# Patient Record
Sex: Female | Born: 1970 | Race: White | Hispanic: No | Marital: Married | Smoking: Never smoker
Health system: Southern US, Community
[De-identification: ages and names within clinical notes are randomized; demographics above are authoritative.]

## PROBLEM LIST (undated history)

## (undated) DIAGNOSIS — N200 Calculus of kidney: Secondary | ICD-10-CM

---

## 1997-12-05 ENCOUNTER — Other Ambulatory Visit: Admission: RE | Admit: 1997-12-05 | Discharge: 1997-12-05 | Payer: Self-pay | Admitting: *Deleted

## 1997-12-30 ENCOUNTER — Ambulatory Visit (HOSPITAL_COMMUNITY): Admission: RE | Admit: 1997-12-30 | Discharge: 1997-12-30 | Payer: Self-pay | Admitting: Obstetrics

## 1998-01-07 ENCOUNTER — Encounter: Admission: RE | Admit: 1998-01-07 | Discharge: 1998-04-07 | Payer: Self-pay | Admitting: Obstetrics & Gynecology

## 1998-02-06 ENCOUNTER — Ambulatory Visit (HOSPITAL_COMMUNITY): Admission: RE | Admit: 1998-02-06 | Discharge: 1998-02-06 | Payer: Self-pay

## 1998-03-06 ENCOUNTER — Ambulatory Visit (HOSPITAL_COMMUNITY): Admission: RE | Admit: 1998-03-06 | Discharge: 1998-03-06 | Payer: Self-pay | Admitting: Obstetrics & Gynecology

## 1998-03-26 ENCOUNTER — Inpatient Hospital Stay (HOSPITAL_COMMUNITY): Admission: RE | Admit: 1998-03-26 | Discharge: 1998-03-28 | Payer: Self-pay | Admitting: Obstetrics & Gynecology

## 1998-04-02 ENCOUNTER — Ambulatory Visit (HOSPITAL_COMMUNITY): Admission: RE | Admit: 1998-04-02 | Discharge: 1998-04-02 | Payer: Self-pay | Admitting: Obstetrics & Gynecology

## 1998-04-09 ENCOUNTER — Encounter: Admission: RE | Admit: 1998-04-09 | Discharge: 1998-07-08 | Payer: Self-pay | Admitting: Obstetrics & Gynecology

## 1998-04-09 ENCOUNTER — Ambulatory Visit (HOSPITAL_COMMUNITY): Admission: RE | Admit: 1998-04-09 | Discharge: 1998-04-09 | Payer: Self-pay | Admitting: Obstetrics & Gynecology

## 1998-04-15 ENCOUNTER — Inpatient Hospital Stay (HOSPITAL_COMMUNITY): Admission: AD | Admit: 1998-04-15 | Discharge: 1998-04-18 | Payer: Self-pay | Admitting: *Deleted

## 1998-04-15 ENCOUNTER — Encounter (HOSPITAL_COMMUNITY): Admission: RE | Admit: 1998-04-15 | Discharge: 1998-06-02 | Payer: Self-pay | Admitting: Obstetrics

## 1998-04-24 ENCOUNTER — Ambulatory Visit (HOSPITAL_COMMUNITY): Admission: RE | Admit: 1998-04-24 | Discharge: 1998-04-24 | Payer: Self-pay | Admitting: Obstetrics & Gynecology

## 1998-05-31 ENCOUNTER — Inpatient Hospital Stay (HOSPITAL_COMMUNITY): Admission: AD | Admit: 1998-05-31 | Discharge: 1998-06-02 | Payer: Self-pay | Admitting: *Deleted

## 2006-01-05 ENCOUNTER — Encounter: Payer: Self-pay | Admitting: Obstetrics & Gynecology

## 2006-08-29 ENCOUNTER — Inpatient Hospital Stay (HOSPITAL_COMMUNITY): Admission: AD | Admit: 2006-08-29 | Discharge: 2006-08-29 | Payer: Self-pay | Admitting: Obstetrics and Gynecology

## 2007-12-19 ENCOUNTER — Emergency Department (HOSPITAL_COMMUNITY): Admission: EM | Admit: 2007-12-19 | Discharge: 2007-12-19 | Payer: Self-pay | Admitting: Emergency Medicine

## 2008-07-22 ENCOUNTER — Inpatient Hospital Stay (HOSPITAL_COMMUNITY): Admission: AD | Admit: 2008-07-22 | Discharge: 2008-07-22 | Payer: Self-pay | Admitting: Obstetrics & Gynecology

## 2008-08-20 ENCOUNTER — Ambulatory Visit (HOSPITAL_COMMUNITY): Admission: RE | Admit: 2008-08-20 | Discharge: 2008-08-20 | Payer: Self-pay | Admitting: Family Medicine

## 2008-09-10 ENCOUNTER — Ambulatory Visit (HOSPITAL_COMMUNITY): Admission: RE | Admit: 2008-09-10 | Discharge: 2008-09-10 | Payer: Self-pay | Admitting: Family Medicine

## 2008-09-26 ENCOUNTER — Ambulatory Visit (HOSPITAL_COMMUNITY): Admission: RE | Admit: 2008-09-26 | Discharge: 2008-09-26 | Payer: Self-pay | Admitting: Family Medicine

## 2009-02-21 ENCOUNTER — Inpatient Hospital Stay (HOSPITAL_COMMUNITY): Admission: AD | Admit: 2009-02-21 | Discharge: 2009-02-23 | Payer: Self-pay | Admitting: Family Medicine

## 2009-08-22 ENCOUNTER — Ambulatory Visit (HOSPITAL_COMMUNITY): Admission: RE | Admit: 2009-08-22 | Discharge: 2009-08-22 | Payer: Self-pay | Admitting: Obstetrics and Gynecology

## 2010-02-13 IMAGING — RF DG HYSTEROGRAM (HSD)
5 series · 5 of 5 positions shown · non-contrast
Comparison: none

CLINICAL DATA: Post Essure evaluation

HYSTEROSALPINGOGRAM
TECHNIQUE: Hysterosalpingogram was performed by the ordering
physician under fluoroscopy.  Fluoroscopic images are submitted for
interpretation following the procedure.
Fluoroscopy Time:  0.9 minutes.

[Series 1: run · 1 of 1 slices shown (1 of 5)]
[im 1/1]
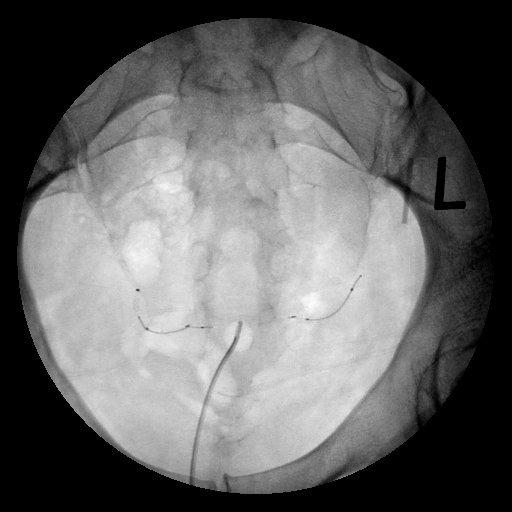

[Series 2: run · 1 of 1 slices shown (2 of 5)]
[im 1/1]
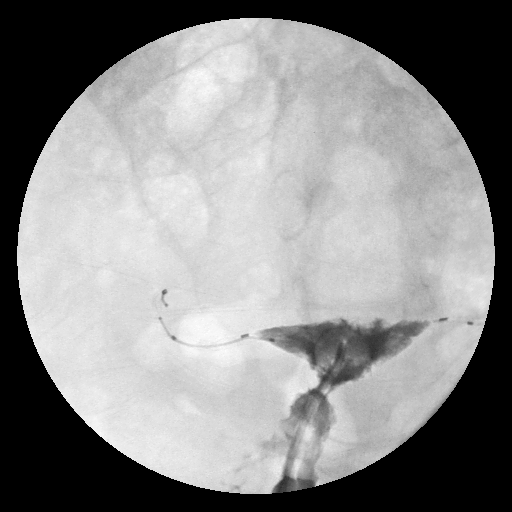

[Series 3: run · 1 of 1 slices shown (3 of 5)]
[im 1/1]
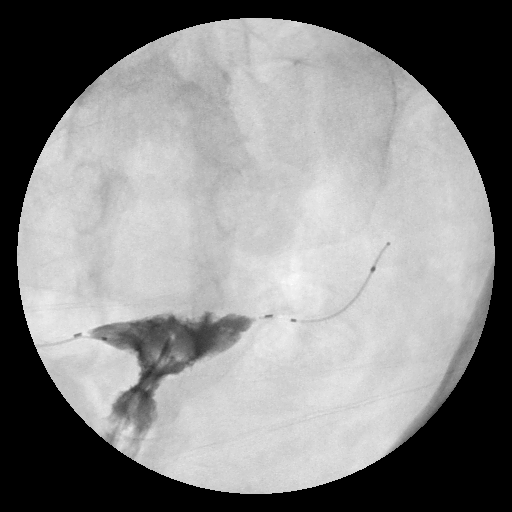

[Series 4: run · 1 of 1 slices shown (4 of 5)]
[im 1/1]
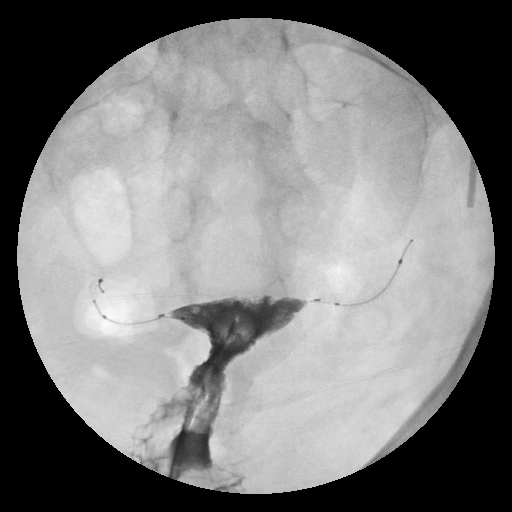

[Series 5: run · 1 of 1 slices shown (5 of 5)]
[im 1/1]
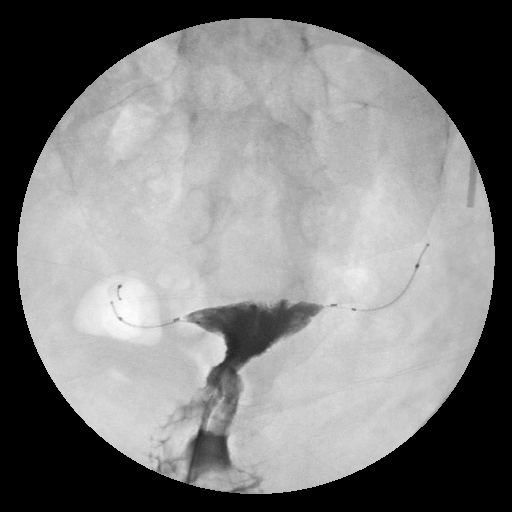

[5 of 5 positions shown; findings below may reference images not displayed]

FINDINGS: Bilateral Essure implants are identified with the
proximal coil located at the tubal-cornual junction on the left and
crossing the tubal-cornual junction on the right.  Both implants
are in appropriate location.  No evidence for contrast is seen
beyond the level of the cornua bilaterally and suggests functional
occlusion bilaterally.

The visualized portion of the endometrium is unremarkable
IMPRESSION: Findings compatible with appropriate Essure implant placement and
correlating with functional occlusion.

## 2010-12-14 ENCOUNTER — Emergency Department: Payer: Self-pay | Admitting: Emergency Medicine

## 2010-12-14 LAB — CBC
HCT: 34 % — ABNORMAL LOW (ref 36.0–46.0)
Hemoglobin: 11.9 g/dL — ABNORMAL LOW (ref 12.0–15.0)
Hemoglobin: 12.5 g/dL (ref 12.0–15.0)
MCHC: 34.4 g/dL (ref 30.0–36.0)
MCV: 91.7 fL (ref 78.0–100.0)
RBC: 3.71 MIL/uL — ABNORMAL LOW (ref 3.87–5.11)
RBC: 3.98 MIL/uL (ref 3.87–5.11)
WBC: 15.7 10*3/uL — ABNORMAL HIGH (ref 4.0–10.5)

## 2011-06-01 LAB — URINALYSIS, ROUTINE W REFLEX MICROSCOPIC
Glucose, UA: NEGATIVE
Specific Gravity, Urine: 1.003 — ABNORMAL LOW
pH: 6.5

## 2011-06-01 LAB — WET PREP, GENITAL: Trich, Wet Prep: NONE SEEN

## 2011-06-01 LAB — CBC
HCT: 44.9
MCV: 91.1
RBC: 4.92
WBC: 10.8 — ABNORMAL HIGH

## 2011-06-01 LAB — DIFFERENTIAL
Eosinophils Absolute: 0.2
Eosinophils Relative: 2
Lymphocytes Relative: 24
Lymphs Abs: 2.6
Monocytes Relative: 6
Neutrophils Relative %: 67

## 2011-06-01 LAB — BASIC METABOLIC PANEL
Chloride: 103
GFR calc Af Amer: >60
Potassium: 3.9

## 2011-06-01 LAB — URINE MICROSCOPIC-ADD ON

## 2011-06-01 LAB — GC/CHLAMYDIA PROBE AMP, GENITAL: Chlamydia, DNA Probe: NEGATIVE

## 2011-06-08 LAB — WET PREP, GENITAL: Trich, Wet Prep: NONE SEEN

## 2011-06-08 LAB — URINALYSIS, ROUTINE W REFLEX MICROSCOPIC
Bilirubin Urine: NEGATIVE
Nitrite: NEGATIVE
Protein, ur: NEGATIVE
Specific Gravity, Urine: >1.03 — ABNORMAL HIGH
Urobilinogen, UA: 0.2

## 2011-06-08 LAB — GC/CHLAMYDIA PROBE AMP, GENITAL
Chlamydia, DNA Probe: NEGATIVE
GC Probe Amp, Genital: NEGATIVE

## 2011-06-08 LAB — URINE MICROSCOPIC-ADD ON

## 2012-11-17 ENCOUNTER — Emergency Department: Payer: Self-pay | Admitting: Emergency Medicine

## 2012-11-17 LAB — URINALYSIS, COMPLETE
Ketone: NEGATIVE
Ph: 7 (ref 4.5–8.0)
Protein: NEGATIVE
RBC,UR: 53 /HPF (ref 0–5)
Squamous Epithelial: 2
WBC UR: 2 /HPF (ref 0–5)

## 2012-11-17 LAB — PREGNANCY, URINE: Pregnancy Test, Urine: NEGATIVE m[IU]/mL

## 2012-11-17 LAB — WET PREP, GENITAL

## 2014-06-10 ENCOUNTER — Emergency Department: Payer: Self-pay | Admitting: Emergency Medicine

## 2015-04-08 ENCOUNTER — Encounter: Payer: Self-pay | Admitting: Podiatry

## 2015-04-08 ENCOUNTER — Ambulatory Visit (INDEPENDENT_AMBULATORY_CARE_PROVIDER_SITE_OTHER): Payer: 59

## 2015-04-08 ENCOUNTER — Ambulatory Visit (INDEPENDENT_AMBULATORY_CARE_PROVIDER_SITE_OTHER): Payer: 59 | Admitting: Podiatry

## 2015-04-08 VITALS — BP 133/88 | HR 93 | Resp 18 | Ht 66.0 in | Wt 195.0 lb

## 2015-04-08 DIAGNOSIS — M722 Plantar fascial fibromatosis: Secondary | ICD-10-CM

## 2015-04-08 DIAGNOSIS — M79673 Pain in unspecified foot: Secondary | ICD-10-CM | POA: Diagnosis not present

## 2015-04-08 MED ORDER — MELOXICAM 7.5 MG PO TABS: 7.5000 mg | ORAL_TABLET | Freq: Every day | ORAL | Status: DC

## 2015-04-08 NOTE — Patient Instructions (Signed)
Plantar Fasciitis (Heel Spur Syndrome) with Rehab The plantar fascia is a fibrous, ligament-like, soft-tissue structure that spans the bottom of the foot. Plantar fasciitis is a condition that causes pain in the foot due to inflammation of the tissue. SYMPTOMS   Pain and tenderness on the underneath side of the foot.  Pain that worsens with standing or walking. CAUSES  Plantar fasciitis is caused by irritation and injury to the plantar fascia on the underneath side of the foot. Common mechanisms of injury include:  Direct trauma to bottom of the foot.  Damage to a small nerve that runs under the foot where the main fascia attaches to the heel bone.  Stress placed on the plantar fascia due to bone spurs. RISK INCREASES WITH:   Activities that place stress on the plantar fascia (running, jumping, pivoting, or cutting).  Poor strength and flexibility.  Improperly fitted shoes.  Tight calf muscles.  Flat feet.  Failure to warm-up properly before activity.  Obesity. PREVENTION  Warm up and stretch properly before activity.  Allow for adequate recovery between workouts.  Maintain physical fitness:  Strength, flexibility, and endurance.  Cardiovascular fitness.  Maintain a health body weight.  Avoid stress on the plantar fascia.  Wear properly fitted shoes, including arch supports for individuals who have flat feet. PROGNOSIS  If treated properly, then the symptoms of plantar fasciitis usually resolve without surgery. However, occasionally surgery is necessary. RELATED COMPLICATIONS   Recurrent symptoms that may result in a chronic condition.  Problems of the lower back that are caused by compensating for the injury, such as limping.  Pain or weakness of the foot during push-off following surgery.  Chronic inflammation, scarring, and partial or complete fascia tear, occurring more often from repeated injections. TREATMENT  Treatment initially involves the use of  ice and medication to help reduce pain and inflammation. The use of strengthening and stretching exercises may help reduce pain with activity, especially stretches of the Achilles tendon. These exercises may be performed at home or with a therapist. Your caregiver may recommend that you use heel cups of arch supports to help reduce stress on the plantar fascia. Occasionally, corticosteroid injections are given to reduce inflammation. If symptoms persist for greater than 6 months despite non-surgical (conservative), then surgery may be recommended.  MEDICATION   If pain medication is necessary, then nonsteroidal anti-inflammatory medications, such as aspirin and ibuprofen, or other minor pain relievers, such as acetaminophen, are often recommended.  Do not take pain medication within 7 days before surgery.  Prescription pain relievers may be given if deemed necessary by your caregiver. Use only as directed and only as much as you need.  Corticosteroid injections may be given by your caregiver. These injections should be reserved for the most serious cases, because they may only be given a certain number of times. HEAT AND COLD  Cold treatment (icing) relieves pain and reduces inflammation. Cold treatment should be applied for 10 to 15 minutes every 2 to 3 hours for inflammation and pain and immediately after any activity that aggravates your symptoms. Use ice packs or massage the area with a piece of ice (ice massage).  Heat treatment may be used prior to performing the stretching and strengthening activities prescribed by your caregiver, physical therapist, or athletic trainer. Use a heat pack or soak the injury in warm water. SEEK IMMEDIATE MEDICAL CARE IF:  Treatment seems to offer no benefit, or the condition worsens.  Any medications produce adverse side effects. EXERCISES RANGE   OF MOTION (ROM) AND STRETCHING EXERCISES - Plantar Fasciitis (Heel Spur Syndrome) These exercises may help you  when beginning to rehabilitate your injury. Your symptoms may resolve with or without further involvement from your physician, physical therapist or athletic trainer. While completing these exercises, remember:   Restoring tissue flexibility helps normal motion to return to the joints. This allows healthier, less painful movement and activity.  An effective stretch should be held for at least 30 seconds.  A stretch should never be painful. You should only feel a gentle lengthening or release in the stretched tissue. RANGE OF MOTION - Toe Extension, Flexion  Sit with your right / left leg crossed over your opposite knee.  Grasp your toes and gently pull them back toward the top of your foot. You should feel a stretch on the bottom of your toes and/or foot.  Hold this stretch for __________ seconds.  Now, gently pull your toes toward the bottom of your foot. You should feel a stretch on the top of your toes and or foot.  Hold this stretch for __________ seconds. Repeat __________ times. Complete this stretch __________ times per day.  RANGE OF MOTION - Ankle Dorsiflexion, Active Assisted  Remove shoes and sit on a chair that is preferably not on a carpeted surface.  Place right / left foot under knee. Extend your opposite leg for support.  Keeping your heel down, slide your right / left foot back toward the chair until you feel a stretch at your ankle or calf. If you do not feel a stretch, slide your bottom forward to the edge of the chair, while still keeping your heel down.  Hold this stretch for __________ seconds. Repeat __________ times. Complete this stretch __________ times per day.  STRETCH - Gastroc, Standing  Place hands on wall.  Extend right / left leg, keeping the front knee somewhat bent.  Slightly point your toes inward on your back foot.  Keeping your right / left heel on the floor and your knee straight, shift your weight toward the wall, not allowing your back to  arch.  You should feel a gentle stretch in the right / left calf. Hold this position for __________ seconds. Repeat __________ times. Complete this stretch __________ times per day. STRETCH - Soleus, Standing  Place hands on wall.  Extend right / left leg, keeping the other knee somewhat bent.  Slightly point your toes inward on your back foot.  Keep your right / left heel on the floor, bend your back knee, and slightly shift your weight over the back leg so that you feel a gentle stretch deep in your back calf.  Hold this position for __________ seconds. Repeat __________ times. Complete this stretch __________ times per day. STRETCH - Gastrocsoleus, Standing  Note: This exercise can place a lot of stress on your foot and ankle. Please complete this exercise only if specifically instructed by your caregiver.   Place the ball of your right / left foot on a step, keeping your other foot firmly on the same step.  Hold on to the wall or a rail for balance.  Slowly lift your other foot, allowing your body weight to press your heel down over the edge of the step.  You should feel a stretch in your right / left calf.  Hold this position for __________ seconds.  Repeat this exercise with a slight bend in your right / left knee. Repeat __________ times. Complete this stretch __________ times per day.    STRENGTHENING EXERCISES - Plantar Fasciitis (Heel Spur Syndrome)  These exercises may help you when beginning to rehabilitate your injury. They may resolve your symptoms with or without further involvement from your physician, physical therapist or athletic trainer. While completing these exercises, remember:   Muscles can gain both the endurance and the strength needed for everyday activities through controlled exercises.  Complete these exercises as instructed by your physician, physical therapist or athletic trainer. Progress the resistance and repetitions only as guided. STRENGTH -  Towel Curls  Sit in a chair positioned on a non-carpeted surface.  Place your foot on a towel, keeping your heel on the floor.  Pull the towel toward your heel by only curling your toes. Keep your heel on the floor.  If instructed by your physician, physical therapist or athletic trainer, add ____________________ at the end of the towel. Repeat __________ times. Complete this exercise __________ times per day. STRENGTH - Ankle Inversion  Secure one end of a rubber exercise band/tubing to a fixed object (table, pole). Loop the other end around your foot just before your toes.  Place your fists between your knees. This will focus your strengthening at your ankle.  Slowly, pull your big toe up and in, making sure the band/tubing is positioned to resist the entire motion.  Hold this position for __________ seconds.  Have your muscles resist the band/tubing as it slowly pulls your foot back to the starting position. Repeat __________ times. Complete this exercises __________ times per day.  Document Released: 08/23/2005 Document Revised: 11/15/2011 Document Reviewed: 12/05/2008 [employer] Patient Information 2015 [employer]. This information is not intended to replace advice given to you by your health care provider. Make sure you discuss any questions you have with your health care provider.  

## 2015-04-08 NOTE — Progress Notes (Signed)
   Subjective:    Patient ID: Courtney Dunn, female    DOB: Nov 30, 1970, 44 y.o.   MRN: [id number]  HPI 44 year old female presents the operative complaints of pain to the arch of her foot on both feet which is been ongoing for approximately 6-8 months. She states the pain first started on the right side and the last several months has progressed the left side as well. She states that she has pain in the morning when she first gets up and after periods of activity. She states majority the pain is within the arch of the foot. She denies any swelling or redness. No tenderness. She denies any history of injury or trauma. She has tried some stretching exercises which may help some although not significant enough. No other complaints at this time.   Review of Systems  All other systems reviewed and are negative.      Objective:   Physical Exam AAO x3, NAD DP/PT pulses palpable bilaterally, CRT less than 3 seconds Protective sensation intact with Simms Weinstein monofilament, vibratory sensation intact, Achilles tendon reflex intact There is a mild decrease in medial arch height upon weightbearing. There is tenderness palpation overlying the medial band of the plantar fascia within the arch of the foot. There is no pain along the insertion of the calcaneus. There does not appear to be any defect or tear of the ligament. There is no pain on the course of the Achilles tendon or the insertion into the calcaneus. No pain with lateral compression of the calcaneus. No other areas of tenderness to bilateral lower extremities. MMT 5/5, ROM WNL.  No open lesions or pre-ulcerative lesions.  No overlying edema, erythema, increase in warmth to bilateral lower extremities.  No pain with calf compression, swelling, warmth, erythema bilaterally.     Assessment & Plan:  44 year old female with bilateral arch pain on the medial band of the plantar fascia -Treatment options discussed including all alternatives,  risks, and complications -X-rays were obtained and reviewed with the patient.  -Discussed etiology of her symptoms -Operative pain is biomechanical in nature. It discuss orthotics with her. She will look at an over-the-counter pair. I discussed with her what to look form purchase in these. Also discussed shoe gear modifications. -Prescribed mobic. Discussed side effects of the medication and directed to stop if any are to occur and call the office.  -Stretching exercises daily. -Ice to the area. -Follow-up 4-6 weeks or sooner if any problems arise. In the meantime, encouraged to call the office with any questions, concerns, change in symptoms.   Ovid Curd, DPM

## 2015-12-07 DIAGNOSIS — J329 Chronic sinusitis, unspecified: Secondary | ICD-10-CM | POA: Insufficient documentation

## 2016-10-14 DIAGNOSIS — L301 Dyshidrosis [pompholyx]: Secondary | ICD-10-CM | POA: Insufficient documentation

## 2016-10-15 ENCOUNTER — Other Ambulatory Visit: Payer: Self-pay | Admitting: Family Medicine

## 2016-10-15 DIAGNOSIS — Z1239 Encounter for other screening for malignant neoplasm of breast: Secondary | ICD-10-CM

## 2018-05-23 DIAGNOSIS — L709 Acne, unspecified: Secondary | ICD-10-CM | POA: Insufficient documentation

## 2019-06-12 ENCOUNTER — Other Ambulatory Visit: Payer: Self-pay

## 2019-06-12 DIAGNOSIS — Z20828 Contact with and (suspected) exposure to other viral communicable diseases: Secondary | ICD-10-CM

## 2019-06-12 DIAGNOSIS — Z20822 Contact with and (suspected) exposure to covid-19: Secondary | ICD-10-CM

## 2019-06-14 LAB — NOVEL CORONAVIRUS, NAA: SARS-CoV-2, NAA: NOT DETECTED

## 2023-01-21 ENCOUNTER — Emergency Department
Admission: EM | Admit: 2023-01-21 | Discharge: 2023-01-21 | Disposition: A | Discharge status: Home / Self Care | Payer: Medicaid Other | Attending: Emergency Medicine | Admitting: Emergency Medicine

## 2023-01-21 ENCOUNTER — Other Ambulatory Visit: Payer: Self-pay

## 2023-01-21 ENCOUNTER — Telehealth: Payer: Self-pay

## 2023-01-21 ENCOUNTER — Emergency Department: Payer: Medicaid Other

## 2023-01-21 DIAGNOSIS — N132 Hydronephrosis with renal and ureteral calculous obstruction: Secondary | ICD-10-CM | POA: Insufficient documentation

## 2023-01-21 DIAGNOSIS — N201 Calculus of ureter: Secondary | ICD-10-CM

## 2023-01-21 DIAGNOSIS — R1031 Right lower quadrant pain: Secondary | ICD-10-CM | POA: Diagnosis present

## 2023-01-21 DIAGNOSIS — N200 Calculus of kidney: Secondary | ICD-10-CM

## 2023-01-21 HISTORY — DX: Calculus of kidney: N20.0

## 2023-01-21 LAB — URINALYSIS, W/ REFLEX TO CULTURE (INFECTION SUSPECTED)
Bacteria, UA: NONE SEEN
Bilirubin Urine: NEGATIVE
Glucose, UA: NEGATIVE mg/dL
Ketones, ur: NEGATIVE mg/dL
Leukocytes,Ua: NEGATIVE
Nitrite: NEGATIVE
Protein, ur: 30 mg/dL — AB
RBC / HPF: >50 RBC/hpf (ref 0–5)
Specific Gravity, Urine: 1.012 (ref 1.005–1.030)
pH: 7 (ref 5.0–8.0)

## 2023-01-21 LAB — LIPASE, BLOOD: Lipase: 40 U/L (ref 11–51)

## 2023-01-21 LAB — COMPREHENSIVE METABOLIC PANEL
ALT: 17 U/L (ref 0–44)
AST: 22 U/L (ref 15–41)
Albumin: 3.7 g/dL (ref 3.5–5.0)
Alkaline Phosphatase: 49 U/L (ref 38–126)
Anion gap: 5 (ref 5–15)
BUN: 16 mg/dL (ref 6–20)
CO2: 25 mmol/L (ref 22–32)
Calcium: 8.7 mg/dL — ABNORMAL LOW (ref 8.9–10.3)
Chloride: 107 mmol/L (ref 98–111)
Creatinine, Ser: 0.87 mg/dL (ref 0.44–1.00)
GFR, Estimated: >60 mL/min (ref >60)
Glucose, Bld: 121 mg/dL — ABNORMAL HIGH (ref 70–99)
Potassium: 3.6 mmol/L (ref 3.5–5.1)
Sodium: 137 mmol/L (ref 135–145)
Total Bilirubin: 0.6 mg/dL (ref 0.3–1.2)
Total Protein: 7.4 g/dL (ref 6.5–8.1)

## 2023-01-21 LAB — CBC WITH DIFFERENTIAL/PLATELET
Abs Immature Granulocytes: 0.07 10*3/uL (ref 0.00–0.07)
Basophils Absolute: 0.1 10*3/uL (ref 0.0–0.1)
Basophils Relative: 0 %
Eosinophils Absolute: 0.1 10*3/uL (ref 0.0–0.5)
Eosinophils Relative: 1 %
HCT: 41.4 % (ref 36.0–46.0)
Hemoglobin: 13.3 g/dL (ref 12.0–15.0)
Immature Granulocytes: 0 %
Lymphocytes Relative: 6 %
Lymphs Abs: 1.1 10*3/uL (ref 0.7–4.0)
MCH: 28.4 pg (ref 26.0–34.0)
MCHC: 32.1 g/dL (ref 30.0–36.0)
MCV: 88.3 fL (ref 80.0–100.0)
Monocytes Absolute: 0.7 10*3/uL (ref 0.1–1.0)
Monocytes Relative: 4 %
Neutro Abs: 15.8 10*3/uL — ABNORMAL HIGH (ref 1.7–7.7)
Neutrophils Relative %: 89 %
Platelets: 422 10*3/uL — ABNORMAL HIGH (ref 150–400)
RBC: 4.69 MIL/uL (ref 3.87–5.11)
RDW: 13.5 % (ref 11.5–15.5)
WBC: 17.9 10*3/uL — ABNORMAL HIGH (ref 4.0–10.5)
nRBC: 0 % (ref 0.0–0.2)

## 2023-01-21 LAB — HCG, QUANTITATIVE, PREGNANCY: hCG, Beta Chain, Quant, S: <1 m[IU]/mL (ref <5)

## 2023-01-21 MED ORDER — KETOROLAC TROMETHAMINE 10 MG PO TABS: 10.0000 mg | ORAL_TABLET | Freq: Four times a day (QID) | ORAL | 0 refills | Status: DC | PRN

## 2023-01-21 MED ORDER — ONDANSETRON HCL 4 MG/2ML IJ SOLN
4.0000 mg | Freq: Once | INTRAMUSCULAR | Status: AC
  Administered 2023-01-21: 4 mg via INTRAVENOUS
  Filled 2023-01-21: qty 2

## 2023-01-21 MED ORDER — TAMSULOSIN HCL 0.4 MG PO CAPS: 0.4000 mg | ORAL_CAPSULE | Freq: Every day | ORAL | 0 refills | Status: AC

## 2023-01-21 MED ORDER — MORPHINE SULFATE (PF) 4 MG/ML IV SOLN
4.0000 mg | Freq: Once | INTRAVENOUS | Status: AC
  Administered 2023-01-21: 4 mg via INTRAVENOUS
  Filled 2023-01-21: qty 1

## 2023-01-21 MED ORDER — TAMSULOSIN HCL 0.4 MG PO CAPS
0.8000 mg | ORAL_CAPSULE | Freq: Once | ORAL | Status: AC
  Administered 2023-01-21: 0.8 mg via ORAL
  Filled 2023-01-21: qty 2

## 2023-01-21 MED ORDER — HYDROCODONE-ACETAMINOPHEN 5-325 MG PO TABS: 1.0000 | ORAL_TABLET | ORAL | 0 refills | Status: DC | PRN

## 2023-01-21 MED ORDER — KETOROLAC TROMETHAMINE 30 MG/ML IJ SOLN
30.0000 mg | Freq: Once | INTRAMUSCULAR | Status: AC
  Administered 2023-01-21: 30 mg via INTRAVENOUS
  Filled 2023-01-21: qty 1

## 2023-01-21 MED ORDER — SODIUM CHLORIDE 0.9 % IV BOLUS
1000.0000 mL | Freq: Once | INTRAVENOUS | Status: AC
  Administered 2023-01-21: 1000 mL via INTRAVENOUS

## 2023-01-21 MED ORDER — IOHEXOL 300 MG/ML  SOLN
100.0000 mL | Freq: Once | INTRAMUSCULAR | Status: AC | PRN
  Administered 2023-01-21: 100 mL via INTRAVENOUS

## 2023-01-21 MED ORDER — ONDANSETRON 8 MG PO TBDP: 8.0000 mg | ORAL_TABLET | Freq: Three times a day (TID) | ORAL | 0 refills | Status: DC | PRN

## 2023-01-21 NOTE — ED Triage Notes (Signed)
Patient reports right lower abdominal pain and nausea that started earlier this morning; denies vomiting and diarrhea.

## 2023-01-21 NOTE — ED Notes (Signed)
Toradol given, pt to CT. Family into room, at [facility].

## 2023-01-21 NOTE — ED Provider Notes (Signed)
[facility] Provider Note   None    (approximate) History  Abdominal Pain  HPI Courtney Dunn is a 52 y.o. female with stated past medical history of kidney stone who presents complaining of right lower quadrant abdominal pain that radiates around to her back and began at approximately 4 AM just prior to arrival.  Patient endorses nausea without vomiting.  Patient is complaining of 9/10, right lower quadrant pain that radiates around to her mid to upper back and has no exacerbating or relieving factors.  Patient denies trying any medications for this pain.  Patient states she still has gallbladder and appendix. ROS: Patient currently denies any vision changes, tinnitus, difficulty speaking, facial droop, sore throat, chest pain, shortness of breath,  vomiting/diarrhea, dysuria, or weakness/numbness/paresthesias in any extremity   Physical Exam  Triage Vital Signs: ED Triage Vitals  Enc Vitals Group     BP      Pulse      Resp      Temp      Temp src      SpO2      Weight      Height      Head Circumference      Peak Flow      Pain Score      Pain Loc      Pain Edu?      Excl. in GC?    Most recent vital signs: Vitals:   01/21/23 1115 01/21/23 1123  BP:  (!) 149/88  Pulse: 79 80  Resp: (!) 21 20  Temp:  98.5 F (36.9 C)  SpO2: 100% 100%   General: Awake, oriented x4. CV:  Good peripheral perfusion.  Resp:  Normal effort.  Abd:  No distention.  Right lower quadrant tenderness to palpation Other:  Middle-aged overweight Caucasian female laying in bed in mild distress secondary to pain ED Results / Procedures / Treatments  Labs (all labs ordered are listed, but only abnormal results are displayed) Labs Reviewed  URINALYSIS, W/ REFLEX TO CULTURE (INFECTION SUSPECTED) - Abnormal; Notable for the following components:      Result Value   Color, Urine YELLOW (*)    APPearance HAZY (*)    Hgb urine dipstick LARGE (*)    Protein, ur 30 (*)     All other components within normal limits  CBC WITH DIFFERENTIAL/PLATELET - Abnormal; Notable for the following components:   WBC 17.9 (*)    Platelets 422 (*)    Neutro Abs 15.8 (*)    All other components within normal limits  COMPREHENSIVE METABOLIC PANEL - Abnormal; Notable for the following components:   Glucose, Bld 121 (*)    Calcium 8.7 (*)    All other components within normal limits  LIPASE, BLOOD  HCG, QUANTITATIVE, PREGNANCY  POC URINE PREG, ED   RADIOLOGY ED MD interpretation: CT of the abdomen and pelvis with IV contrast shows an 11 x 6 mm calculus in the right ureteropelvic junction with mild surrounding hydronephrosis -Agree with radiology assessment Official radiology report(s): CT ABDOMEN PELVIS W CONTRAST  Result Date: 01/21/2023 CLINICAL DATA:  Acute right lower quadrant abdominal pain. EXAM: CT ABDOMEN AND PELVIS WITH CONTRAST TECHNIQUE: Multidetector CT imaging of the abdomen and pelvis was performed using the standard protocol following bolus administration of intravenous contrast. RADIATION DOSE REDUCTION: This exam was performed according to the departmental dose-optimization program which includes automated exposure control, adjustment of the mA and/or kV according to patient size  and/or use of iterative reconstruction technique. CONTRAST:  OMNIPAQUE IOHEXOL 300 MG/ML  SOLN COMPARISON:  None Available. FINDINGS: Lower chest: No acute abnormality. Hepatobiliary: No focal liver abnormality is seen. No gallstones, gallbladder wall thickening, or biliary dilatation. Pancreas: Unremarkable. No pancreatic ductal dilatation or surrounding inflammatory changes. Spleen: Normal in size without focal abnormality. Adrenals/Urinary Tract: Adrenal glands appear normal. Mild right hydronephrosis is noted secondary to 11 x 6 mm calculus at right ureteropelvic junction. Mild amount of perinephric fluid is noted. Left kidney and ureter are unremarkable. Urinary bladder is  unremarkable. Stomach/Bowel: Stomach is within normal limits. Appendix appears normal. No evidence of bowel wall thickening, distention, or inflammatory changes. Vascular/Lymphatic: No significant vascular findings are present. No enlarged abdominal or pelvic lymph nodes. Reproductive: Uterus is unremarkable. Occlusion coils are noted in expected position of both fallopian tubes. No adnexal abnormality is noted. Other: No abdominal wall hernia or abnormality. No abdominopelvic ascites. Musculoskeletal: No fracture is noted. Focal sclerotic density measuring 9 mm is noted in L2 vertebral body. Smaller sclerotic density is noted in L4 vertebral body. IMPRESSION: Mild right hydronephrosis with surrounding perinephric fluid is noted secondary to 11 x 6 mm calculus at right ureteropelvic junction. Two small sclerotic densities are noted in the L2 and L4 vertebral bodies, largest measuring 9 mm. 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 Electronically Signed   By: Lupita Raider M.D.   On: 01/21/2023 10:27   PROCEDURES: Critical Care performed: No .1-3 Lead EKG Interpretation  Performed by: Merwyn Katos, MD Authorized by: Merwyn Katos, MD     Interpretation: normal     ECG rate:  71   ECG rate assessment: normal     Rhythm: sinus rhythm     Ectopy: none     Conduction: normal    MEDICATIONS ORDERED IN ED: Medications  ondansetron (ZOFRAN) injection 4 mg (4 mg Intravenous Given 01/21/23 0905)  sodium chloride 0.9 % bolus 1,000 mL (0 mLs Intravenous Stopped 01/21/23 0948)  morphine (PF) 4 MG/ML injection 4 mg (4 mg Intravenous Given 01/21/23 0905)  ketorolac (TORADOL) 30 MG/ML injection 30 mg (30 mg Intravenous Given 01/21/23 0951)  iohexol (OMNIPAQUE) 300 MG/ML solution 100 mL (100 mLs Intravenous Contrast Given 01/21/23 0956)  tamsulosin (FLOMAX)  capsule 0.8 mg (0.8 mg Oral Given 01/21/23 1122)   IMPRESSION / MDM / ASSESSMENT AND PLAN / ED COURSE  I reviewed the triage vital signs and the nursing notes.                             The patient is on the cardiac monitor to evaluate for evidence of arrhythmia and/or significant heart rate changes. Patient's presentation is most consistent with acute presentation with potential threat to life or bodily function. Patient presents for severe flank pain. Presentation most consistent with Renal Colic from a Non-infected Kidney Stone. Given History and Exam I have lower suspicion for atypical appendicitis, genital torsion, acute cholecystitis, AAA, Aortic Dissection, Serious Bacterial Illness or other emergent intraabdominal pathology.  Workup: CBC, BMP, CT Abd/Pelvis noncontrast, UA, reassess Findings: 11 x 6 mm stone at the right UPJ Reassesment: Patient tolerating PO and pain controlled Consults: Spoke with Dr. Irish Elders in urology who will be following up with patient in the outpatient setting Disposition:  Discharge. Strict return precautions for  infected stone or PO intolerance discussed.   FINAL CLINICAL IMPRESSION(S) / ED DIAGNOSES   Final diagnoses:  Kidney stone on right side  Ureterolithiasis   Rx / DC Orders   ED Discharge Orders          Ordered    HYDROcodone-acetaminophen (NORCO) 5-325 MG tablet  Every 4 hours PRN        01/21/23 1103    ketorolac (TORADOL) 10 MG tablet  Every 6 hours PRN       Note to Pharmacy: Patient given an IM/IV loading dose in emergency department   01/21/23 1103    tamsulosin (FLOMAX) 0.4 MG CAPS capsule  Daily        01/21/23 1103    ondansetron (ZOFRAN-ODT) 8 MG disintegrating tablet  Every 8 hours PRN        01/21/23 1103           Note:  This document was prepared using Dragon voice recognition software and may include unintentional dictation errors.   Merwyn Katos, MD 01/21/23 [phone]

## 2023-01-21 NOTE — Telephone Encounter (Signed)
Received message from Dr Apolinar Junes to add this patient to the schedule f/u ER -kidney stone. Added patient for 01/25/23 at 4:15 pm but patient needs to get KUB xray done prior to her appointment.  I left a message for patient to call back to discuss.

## 2023-01-21 NOTE — ED Notes (Signed)
Back from CT, up to [facility], husband at [facility].

## 2023-01-24 NOTE — Telephone Encounter (Signed)
Courtney Dunn called patient and left a message about this appointment and Xray also.

## 2023-01-25 ENCOUNTER — Ambulatory Visit
Admission: RE | Admit: 2023-01-25 | Discharge: 2023-01-25 | Disposition: A | Discharge status: Home / Self Care | Payer: Medicaid Other | Source: Ambulatory Visit | Attending: Urology | Admitting: Urology

## 2023-01-25 ENCOUNTER — Ambulatory Visit
Admission: RE | Admit: 2023-01-25 | Discharge: 2023-01-25 | Disposition: A | Discharge status: Home / Self Care | Payer: Medicaid Other | Attending: Urology | Admitting: Urology

## 2023-01-25 ENCOUNTER — Ambulatory Visit (INDEPENDENT_AMBULATORY_CARE_PROVIDER_SITE_OTHER): Payer: Medicaid Other | Admitting: Urology

## 2023-01-25 VITALS — BP 157/80 | HR 62 | Ht 66.0 in | Wt 176.1 lb

## 2023-01-25 DIAGNOSIS — N200 Calculus of kidney: Secondary | ICD-10-CM

## 2023-01-25 DIAGNOSIS — N201 Calculus of ureter: Secondary | ICD-10-CM | POA: Diagnosis not present

## 2023-01-25 DIAGNOSIS — N1330 Unspecified hydronephrosis: Secondary | ICD-10-CM

## 2023-01-25 NOTE — H&P (View-Only) (Signed)
I,[name],acting as a scribe for Courtney Scotland, MD.,have documented all relevant documentation on the behalf of Courtney Scotland, MD,as directed by  Courtney Scotland, MD while in the presence of Courtney Scotland, MD.  01/25/2023 5:19 PM   Courtney Dunn 02/11/1971 [id number]  Referring provider: Rayetta Humphrey, MD [address] [place],  [place] [address]  Chief Complaint  Patient presents with   Establish Care   Nephrolithiasis    HPI: 52 year-old female presents today to establish care following an emergency room visit.  She was seen in the emergency room on 01/21/2023 with acute right flank pain. She underwent a CT abdomen, pelvis with contrast. It indicated an 11 by 6 millimeter right UPJ stone with mild amount of perinephric fluid and right hydronephrosis. Her labs at the time were unremarkable. Her urinalysis showed only blood, no evidence of infection. She had a leukocytosis to 17.9. She was treated with pain medicine, Flomax, and Toradol, and discharged with outpatient urologic follow-up.  She presents in clinic today with a KUB. Her right UPJ stone is easily visible in an unchanged position. The stone is about 1100 hounsfield units.  Her urinalysis today is negative, only blood present.  She is still sore. This is the only stone she has had and learned about it because it was seen on a back xray about 10-15 years. She was asymptomatic until last Friday with a pain she never had before.    PMH: Past Medical History:  Diagnosis Date   Kidney stone     Home Medications:  Allergies as of 01/25/2023   No Known Allergies      Medication List        Accurate as of Jan 25, 2023  5:19 PM. If you have any questions, ask your nurse or doctor.          STOP taking these medications    meloxicam 7.5 MG tablet Commonly known as: MOBIC   SUMAtriptan 100 MG tablet Commonly known as: IMITREX       TAKE these medications    HYDROcodone-acetaminophen 5-325 MG  tablet Commonly known as: Norco Take 1 tablet by mouth every 4 (four) hours as needed for moderate pain.   ketorolac 10 MG tablet Commonly known as: TORADOL Take 1 tablet (10 mg total) by mouth every 6 (six) hours as needed.   ondansetron 8 MG disintegrating tablet Commonly known as: ZOFRAN-ODT Take 1 tablet (8 mg total) by mouth every 8 (eight) hours as needed for nausea or vomiting.   tamsulosin 0.4 MG Caps capsule Commonly known as: FLOMAX Take 1 capsule (0.4 mg total) by mouth daily for 7 days.        Social History:  reports that she has never smoked. She has never used smokeless tobacco. No history on file for alcohol use and drug use.   Physical Exam: BP (!) 157/80   Pulse 62   Ht 5\' 6"  (1.676 m)   Wt 176 lb 2 oz (79.9 kg)   BMI 28.43 kg/m   Constitutional:  Alert and oriented, No acute distress. HEENT: [place] AT, moist mucus membranes.  Trachea midline, no masses. Cardiovascular: No clubbing, cyanosis, or edema. Respiratory: Normal respiratory effort, no increased work of breathing. GI: Abdomen is soft, nontender, nondistended, no abdominal masses GU: No CVA tenderness Skin: No rashes, bruises or suspicious lesions. Neurologic: Grossly intact, no focal deficits, moving all 4 extremities. Psychiatric: Normal mood and affect.   Pertinent Imaging: CLINICAL DATA:  Acute  right lower quadrant abdominal pain.   EXAM: CT ABDOMEN AND PELVIS WITH CONTRAST   TECHNIQUE: Multidetector CT imaging of the abdomen and pelvis was performed using the standard protocol following bolus administration of intravenous contrast.   RADIATION DOSE REDUCTION: This exam was performed according to the departmental dose-optimization program which includes automated exposure control, adjustment of the mA and/or kV according to patient size and/or use of iterative reconstruction technique.   CONTRAST:  OMNIPAQUE IOHEXOL 300 MG/ML  SOLN   COMPARISON:  None Available.    FINDINGS: Lower chest: No acute abnormality.   Hepatobiliary: No focal liver abnormality is seen. No gallstones, gallbladder wall thickening, or biliary dilatation.   Pancreas: Unremarkable. No pancreatic ductal dilatation or surrounding inflammatory changes.   Spleen: Normal in size without focal abnormality.   Adrenals/Urinary Tract: Adrenal glands appear normal. Mild right hydronephrosis is noted secondary to 11 x 6 mm calculus at right ureteropelvic junction. Mild amount of perinephric fluid is noted. Left kidney and ureter are unremarkable. Urinary bladder is unremarkable.   Stomach/Bowel: Stomach is within normal limits. Appendix appears normal. No evidence of bowel wall thickening, distention, or inflammatory changes.   Vascular/Lymphatic: No significant vascular findings are present. No enlarged abdominal or pelvic lymph nodes.   Reproductive: Uterus is unremarkable. Occlusion coils are noted in expected position of both fallopian tubes. No adnexal abnormality is noted.   Other: No abdominal wall hernia or abnormality. No abdominopelvic ascites.   Musculoskeletal: No fracture is noted. Focal sclerotic density measuring 9 mm is noted in L2 vertebral body. Smaller sclerotic density is noted in L4 vertebral body.   IMPRESSION: Mild right hydronephrosis with surrounding perinephric fluid is noted secondary to 11 x 6 mm calculus at right ureteropelvic junction.   Two small sclerotic densities are noted in the L2 and L4 vertebral bodies, largest measuring 9 mm. 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  Electronically Signed   By: Lupita Raider M.D.   On: 01/21/2023 10:27  KUB performed today and awaiting final radiologic interpretation. Personally reviewed and compared both scans today.     Assessment &  Plan:    Right UPJ stone with hydronephrosis -Large obstructing right UPJ stone with hydronephrosis, no signs of infection  - We discussed various treatment options for urolithiasis including observation with or without medical expulsive therapy, shockwave lithotripsy (SWL), ureteroscopy and laser lithotripsy with stent placement, and percutaneous nephrolithotomy.  - We discussed that management is based on stone size, location, density, patient co-morbidities, and patient preference.   - Stones <81mm in size have a >80% spontaneous passage rate. Data surrounding the use of tamsulosin for medical expulsive therapy is controversial, but meta analyses suggests it is most efficacious for distal stones between 5-63mm in size. Possible side effects include dizziness/lightheadedness, and retrograde ejaculation.  - SWL has a lower stone free rate in a single procedure, but also a lower complication rate compared to ureteroscopy and avoids a stent and associated stent related symptoms. Possible complications include renal hematoma, steinstrasse, and need for additional treatment. We discussed the role of his increased skin to stone distance can lead to decreased efficacy with shockwave lithotripsy. She likely has a 70-80% chance of success with option.  - Ureteroscopy with laser lithotripsy and stent placement has a higher stone free rate than SWL in a single procedure, however increased complication rate including  possible infection, ureteral injury, bleeding, and stent related morbidity. Common stent related symptoms include dysuria, urgency/frequency, and flank pain.  - After an extensive discussion of the risks and benefits of the above treatment options, the patient would like to proceed with shockwave.  - Plan to have this done on Thursday. Avoid NSAIDS before then. Giving her the surgery packet to fill out at home and bring back. Explained she needs a driver and has to be NPO.  I have reviewed the above  documentation for accuracy and completeness, and I agree with the above.   Courtney Scotland, MD    [facility] [address] [place], [place] [address] [phone]

## 2023-01-25 NOTE — Progress Notes (Signed)
 I,Courtney Dunn,acting as Dunn scribe for Courtney Olander, MD.,have documented all relevant documentation on the behalf of Courtney Yetter, MD,as directed by  Courtney Kotch, MD while in the presence of Courtney Galvan, MD.  01/25/2023 5:19 PM   Courtney Dunn 03/08/1971 [id number]  Referring provider: George, Sionne A, MD [address] [place] [address] [place],  [place] [address]  Chief Complaint  Patient presents with   Establish Care   Nephrolithiasis    HPI: 52 year-old female presents today to establish care following an emergency room visit.  She was seen in the emergency room on 01/21/2023 with acute right flank pain. She underwent Dunn CT abdomen, pelvis with contrast. It indicated an 11 by 6 millimeter right UPJ stone with mild amount of perinephric fluid and right hydronephrosis. Her labs at the time were unremarkable. Her urinalysis showed only blood, no evidence of infection. She had Dunn leukocytosis to 17.9. She was treated with pain medicine, Flomax, and Toradol, and discharged with outpatient urologic follow-up.  She presents in clinic today with Dunn KUB. Her right UPJ stone is easily visible in an unchanged position. The stone is about 1100 hounsfield units.  Her urinalysis today is negative, only blood present.  She is still sore. This is the only stone she has had and learned about it because it was seen on Dunn back xray about 10-15 years. She was asymptomatic until last Friday with Dunn pain she never had before.    PMH: Past Medical History:  Diagnosis Date   Kidney stone     Home Medications:  Allergies as of 01/25/2023   No Known Allergies      Medication List        Accurate as of Jan 25, 2023  5:19 PM. If you have any questions, ask your nurse or doctor.          STOP taking these medications    meloxicam 7.5 MG tablet Commonly known as: MOBIC   SUMAtriptan 100 MG tablet Commonly known as: IMITREX       TAKE these medications    HYDROcodone-acetaminophen 5-325 MG  tablet Commonly known as: Norco Take 1 tablet by mouth every 4 (four) hours as needed for moderate pain.   ketorolac 10 MG tablet Commonly known as: TORADOL Take 1 tablet (10 mg total) by mouth every 6 (six) hours as needed.   ondansetron 8 MG disintegrating tablet Commonly known as: ZOFRAN-ODT Take 1 tablet (8 mg total) by mouth every 8 (eight) hours as needed for nausea or vomiting.   tamsulosin 0.4 MG Caps capsule Commonly known as: FLOMAX Take 1 capsule (0.4 mg total) by mouth daily for 7 days.        Social History:  reports that she has never smoked. She has never used smokeless tobacco. No history on file for alcohol use and drug use.   Physical Exam: BP (!) 157/80   Pulse 62   Ht 5' 6" (1.676 m)   Wt 176 lb 2 oz (79.9 kg)   BMI 28.43 kg/m   Constitutional:  Alert and oriented, No acute distress. HEENT: Courtney Dunn AT, moist mucus membranes.  Trachea midline, no masses. Cardiovascular: No clubbing, cyanosis, or edema. Respiratory: Normal respiratory effort, no increased work of breathing. GI: Abdomen is soft, nontender, nondistended, no abdominal masses GU: No CVA tenderness Skin: No rashes, bruises or suspicious lesions. Neurologic: Grossly intact, no focal deficits, moving all 4 extremities. Psychiatric: Normal mood and affect.   Pertinent Imaging: CLINICAL DATA:  Acute   right lower quadrant abdominal pain.   EXAM: CT ABDOMEN AND PELVIS WITH CONTRAST   TECHNIQUE: Multidetector CT imaging of the abdomen and pelvis was performed using the standard protocol following bolus administration of intravenous contrast.   RADIATION DOSE REDUCTION: This exam was performed according to the departmental dose-optimization program which includes automated exposure control, adjustment of the mA and/or kV according to patient size and/or use of iterative reconstruction technique.   CONTRAST:  100mL OMNIPAQUE IOHEXOL 300 MG/ML  SOLN   COMPARISON:  None Available.    FINDINGS: Lower chest: No acute abnormality.   Hepatobiliary: No focal liver abnormality is seen. No gallstones, gallbladder wall thickening, or biliary dilatation.   Pancreas: Unremarkable. No pancreatic ductal dilatation or surrounding inflammatory changes.   Spleen: Normal in size without focal abnormality.   Adrenals/Urinary Tract: Adrenal glands appear normal. Mild right hydronephrosis is noted secondary to 11 x 6 mm calculus at right ureteropelvic junction. Mild amount of perinephric fluid is noted. Left kidney and ureter are unremarkable. Urinary bladder is unremarkable.   Stomach/Bowel: Stomach is within normal limits. Appendix appears normal. No evidence of bowel wall thickening, distention, or inflammatory changes.   Vascular/Lymphatic: No significant vascular findings are present. No enlarged abdominal or pelvic lymph nodes.   Reproductive: Uterus is unremarkable. Occlusion coils are noted in expected position of both fallopian tubes. No adnexal abnormality is noted.   Other: No abdominal wall hernia or abnormality. No abdominopelvic ascites.   Musculoskeletal: No fracture is noted. Focal sclerotic density measuring 9 mm is noted in L2 vertebral body. Smaller sclerotic density is noted in L4 vertebral body.   IMPRESSION: Mild right hydronephrosis with surrounding perinephric fluid is noted secondary to 11 x 6 mm calculus at right ureteropelvic junction.   Two small sclerotic densities are noted in the L2 and L4 vertebral bodies, largest measuring 9 mm. 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  Electronically Signed   By: Courtney Dunn M.D.   On: 01/21/2023 10:27  KUB performed today and awaiting final radiologic interpretation. Personally reviewed and compared both scans today.     Assessment &  Plan:    Right UPJ stone with hydronephrosis -Large obstructing right UPJ stone with hydronephrosis, no signs of infection  - We discussed various treatment options for urolithiasis including observation with or without medical expulsive therapy, shockwave lithotripsy (SWL), ureteroscopy and laser lithotripsy with stent placement, and percutaneous nephrolithotomy.  - We discussed that management is based on stone size, location, density, patient co-morbidities, and patient preference.   - Stones <5mm in size have Dunn >80% spontaneous passage rate. Data surrounding the use of tamsulosin for medical expulsive therapy is controversial, but meta analyses suggests it is most efficacious for distal stones between 5-10mm in size. Possible side effects include dizziness/lightheadedness, and retrograde ejaculation.  - SWL has Dunn lower stone free rate in Dunn single procedure, but also Dunn lower complication rate compared to ureteroscopy and avoids Dunn stent and associated stent related symptoms. Possible complications include renal hematoma, steinstrasse, and need for additional treatment. We discussed the role of his increased skin to stone distance can lead to decreased efficacy with shockwave lithotripsy. She likely has Dunn 70-80% chance of success with option.  - Ureteroscopy with laser lithotripsy and stent placement has Dunn higher stone free rate than SWL in Dunn single procedure, however increased complication rate including   possible infection, ureteral injury, bleeding, and stent related morbidity. Common stent related symptoms include dysuria, urgency/frequency, and flank pain.  - After an extensive discussion of the risks and benefits of the above treatment options, the patient would like to proceed with shockwave.  - Plan to have this done on Thursday. Avoid NSAIDS before then. Giving her the surgery packet to fill out at home and bring back. Explained she needs Dunn driver and has to be NPO.  I have reviewed the above  documentation for accuracy and completeness, and I agree with the above.   Jaedan Huttner, MD    [facility] [address], [address] [place], [place] [address] [phone]   

## 2023-01-26 ENCOUNTER — Other Ambulatory Visit: Payer: Self-pay | Admitting: Urology

## 2023-01-26 DIAGNOSIS — N200 Calculus of kidney: Secondary | ICD-10-CM

## 2023-01-26 LAB — URINALYSIS, COMPLETE
Bilirubin, UA: NEGATIVE
Glucose, UA: NEGATIVE
Ketones, UA: NEGATIVE
Leukocytes,UA: NEGATIVE
Nitrite, UA: NEGATIVE
Specific Gravity, UA: 1.02 (ref 1.005–1.030)
Urobilinogen, Ur: 0.2 mg/dL (ref 0.2–1.0)
pH, UA: 5.5 (ref 5.0–7.5)

## 2023-01-26 LAB — MICROSCOPIC EXAMINATION: RBC, Urine: >30 /hpf — AB (ref 0–2)

## 2023-01-26 MED ORDER — DIAZEPAM 5 MG PO TABS
10.0000 mg | ORAL_TABLET | ORAL | Status: AC
  Administered 2023-01-27: 10 mg via ORAL

## 2023-01-26 MED ORDER — CEPHALEXIN 500 MG PO CAPS
500.0000 mg | ORAL_CAPSULE | Freq: Once | ORAL | Status: AC
  Administered 2023-01-27: 500 mg via ORAL

## 2023-01-26 MED ORDER — ONDANSETRON HCL 4 MG/2ML IJ SOLN
4.0000 mg | Freq: Once | INTRAMUSCULAR | Status: AC
  Administered 2023-01-27: 4 mg via INTRAVENOUS

## 2023-01-26 MED ORDER — DIPHENHYDRAMINE HCL 25 MG PO CAPS
25.0000 mg | ORAL_CAPSULE | ORAL | Status: AC
  Administered 2023-01-27: 25 mg via ORAL

## 2023-01-26 MED ORDER — SODIUM CHLORIDE 0.9 % IV SOLN: INTRAVENOUS | Status: DC

## 2023-01-26 NOTE — Addendum Note (Signed)
Addended by: Letta Kocher A on: 01/26/2023 11:14 AM   Modules accepted: Orders

## 2023-01-26 NOTE — Progress Notes (Signed)
ES[facility] ORDER FORM  Expected date of procedure: 01/27/23  Surgeon: Vanna Scotland, MD  Post op standing: 2-4wk follow up w/KUB prior  Anticoagulation/Aspirin/NSAID standing order: Hold all 72 hours prior  Anesthesia standing order: MAC  VTE standing: SCD's  Dx: Right Ureteral Stone  Procedure: right Extracorporeal shock wave lithotripsy  CPT : 50590  Standing Order Set:   *NPO after mn,  *NS 138ml/hr, Keflex 500mg  PO, Benadryl 25mg  PO, Valium 10mg  PO, Zofran 4mg  IV    Medications if other than standing orders:   NONE

## 2023-01-27 ENCOUNTER — Ambulatory Visit
Admission: RE | Admit: 2023-01-27 | Discharge: 2023-01-27 | Disposition: A | Discharge status: Home / Self Care | Payer: Medicaid Other | Attending: Urology | Admitting: Urology

## 2023-01-27 ENCOUNTER — Other Ambulatory Visit: Payer: Self-pay

## 2023-01-27 ENCOUNTER — Encounter
Admission: RE | Disposition: A | Discharge status: Home / Self Care | Payer: Self-pay | Source: Home / Self Care | Attending: Urology

## 2023-01-27 ENCOUNTER — Encounter: Payer: Self-pay | Admitting: Urology

## 2023-01-27 ENCOUNTER — Ambulatory Visit: Payer: Medicaid Other

## 2023-01-27 DIAGNOSIS — N201 Calculus of ureter: Secondary | ICD-10-CM | POA: Diagnosis present

## 2023-01-27 DIAGNOSIS — N200 Calculus of kidney: Secondary | ICD-10-CM

## 2023-01-27 DIAGNOSIS — N132 Hydronephrosis with renal and ureteral calculous obstruction: Secondary | ICD-10-CM | POA: Diagnosis not present

## 2023-01-27 HISTORY — PX: EXTRACORPOREAL SHOCK WAVE LITHOTRIPSY: SHX1557

## 2023-01-27 LAB — POCT PREGNANCY, URINE: Preg Test, Ur: NEGATIVE

## 2023-01-27 SURGERY — LITHOTRIPSY, ESWL
Anesthesia: Moderate Sedation | Laterality: Right

## 2023-01-27 MED ORDER — HYDROCODONE-ACETAMINOPHEN 5-325 MG PO TABS: 1.0000 | ORAL_TABLET | ORAL | 0 refills | Status: DC | PRN

## 2023-01-27 MED ORDER — DIPHENHYDRAMINE HCL 25 MG PO CAPS
ORAL_CAPSULE | ORAL | Status: AC
  Filled 2023-01-27: qty 1

## 2023-01-27 MED ORDER — ONDANSETRON HCL 4 MG/2ML IJ SOLN
INTRAMUSCULAR | Status: AC
  Filled 2023-01-27: qty 2

## 2023-01-27 MED ORDER — DIAZEPAM 5 MG PO TABS
ORAL_TABLET | ORAL | Status: AC
  Filled 2023-01-27: qty 2

## 2023-01-27 MED ORDER — CEPHALEXIN 500 MG PO CAPS
ORAL_CAPSULE | ORAL | Status: AC
  Filled 2023-01-27: qty 1

## 2023-01-27 NOTE — Interval H&P Note (Signed)
History and Physical Interval Note:  01/27/2023 9:51 AM  Courtney Dunn  has presented today for surgery, with the diagnosis of Right Ureteral Stone.  The various methods of treatment have been discussed with the patient and family. After consideration of risks, benefits and other options for treatment, the patient has consented to  Procedure(s): EXTRACORPOREAL SHOCK WAVE LITHOTRIPSY (ESWL) (Right) as a surgical intervention.  The patient's history has been reviewed, patient examined, no change in status, stable for surgery.  I have reviewed the patient's chart and labs.  Questions were answered to the patient's satisfaction.    RRR CTAB  Vanna Scotland

## 2023-01-28 ENCOUNTER — Encounter: Payer: Self-pay | Admitting: Urology

## 2023-02-16 ENCOUNTER — Ambulatory Visit: Payer: Medicaid Other | Admitting: Physician Assistant

## 2023-03-31 ENCOUNTER — Ambulatory Visit
Admission: EM | Admit: 2023-03-31 | Discharge: 2023-03-31 | Disposition: A | Discharge status: Home / Self Care | Payer: Medicaid Other | Attending: Emergency Medicine | Admitting: Emergency Medicine

## 2023-03-31 DIAGNOSIS — Z872 Personal history of diseases of the skin and subcutaneous tissue: Secondary | ICD-10-CM

## 2023-03-31 DIAGNOSIS — R21 Rash and other nonspecific skin eruption: Secondary | ICD-10-CM

## 2023-03-31 MED ORDER — DOXYCYCLINE HYCLATE 100 MG PO CAPS: 100.0000 mg | ORAL_CAPSULE | Freq: Two times a day (BID) | ORAL | 0 refills | Status: AC

## 2023-03-31 MED ORDER — TRIAMCINOLONE ACETONIDE 0.1 % EX CREA: 1.0000 | TOPICAL_CREAM | Freq: Two times a day (BID) | CUTANEOUS | 0 refills | Status: DC

## 2023-03-31 MED ORDER — PREDNISONE 10 MG (21) PO TBPK: ORAL_TABLET | ORAL | 0 refills | Status: AC

## 2023-03-31 NOTE — ED Triage Notes (Signed)
Pt presents with possible skin infection that started 6 months ago. Pt states it comes and go, now having a flare up on arms and face. Pt states it burns. Has tried multiple OTC medications with no relief.

## 2023-03-31 NOTE — ED Provider Notes (Signed)
HPI  SUBJECTIVE:  Courtney Dunn is a 52 y.o. female who presents with a recurrent rash on her face, forearms and hands for the past 6 months.  It is occasionally on her chest.  It itches and burns.  It recurs in the same area every time.  No pustules, drainage, fevers, crusting, contacts with similar rash.  She has pets, but they do not have fleas.  No new lotions, soaps, detergents, change in medications.  No sensation being bitten at night, blood on the bed close in the morning.  She has had symptoms like this before, but it resolved on its own.  She states that this current episode has been going on for 6 months.  It will get better and worse, but will not completely resolve.  She has tried Neosporin, OTC hydrocortisone, cornstarch, peroxide, Listerine, olive and coconut oil, salicylic acid and alcohol.  No aggravating or alleviating factors.  She has a past medical history of eczema, usually on her hip and hands.  She also has a history of MRSA.  No history of allergies, asthma, diabetes.  Denies stimulants or illicit drug use.  LMP: Intermittent.  Denies possibility of being pregnant.  PCP: [facility]    Past Medical History:  Diagnosis Date   Kidney stone     Past Surgical History:  Procedure Laterality Date   EXTRACORPOREAL SHOCK WAVE LITHOTRIPSY Right 01/27/2023   Procedure: EXTRACORPOREAL SHOCK WAVE LITHOTRIPSY (ESWL);  Surgeon: Vanna Scotland, MD;  Location: [facility] ORS;  Service: Urology;  Laterality: Right;    History reviewed. No pertinent family history.  Social History   Tobacco Use   Smoking status: Never   Smokeless tobacco: Never  Vaping Use   Vaping status: Every Day  Substance Use Topics   Alcohol use: Never   Drug use: Never    No current facility-administered medications for this encounter.  Current Outpatient Medications:    doxycycline (VIBRAMYCIN) 100 MG capsule, Take 1 capsule (100 mg total) by mouth 2 (two) times daily for 7 days., Disp: 14  capsule, Rfl: 0   predniSONE (STERAPRED UNI-PAK 21 TAB) 10 MG (21) TBPK tablet, Dispense one 6 day pack. Take as directed with food., Disp: 21 tablet, Rfl: 0   triamcinolone cream (KENALOG) 0.1 %, Apply 1 Application topically 2 (two) times daily. Apply for 2 weeks. May use on face, Disp: 30 g, Rfl: 0   SUMAtriptan (IMITREX) 25 MG tablet, Take 25 mg by mouth every 2 (two) hours as needed for migraine. May repeat in 2 hours if headache persists or recurs., Disp: , Rfl:   No Known Allergies   ROS  As noted in HPI.   Physical Exam  BP 127/71 (BP Location: Right Arm)   Pulse 64   Temp 97.9 F (36.6 C) (Oral)   Resp 16   LMP  (LMP Unknown) Comment: irregular  SpO2 98%   Constitutional: Well developed, well nourished, no acute distress Eyes:  EOMI, conjunctiva normal bilaterally HENT: Normocephalic, atraumatic,mucus membranes moist Respiratory: Normal inspiratory effort Cardiovascular: Normal rate GI: nondistended skin: Nontender crusty scabbed rash with some dry skin and erythema on face, bilateral forearms and hands.          Musculoskeletal: no deformities Neurologic: Alert & oriented x 3, no focal neuro deficits Psychiatric: Speech and behavior appropriate   ED Course   Medications - No data to display  Orders Placed This Encounter  Procedures   Ambulatory referral to Dermatology    Referral Priority:  Routine    Referral Type:   Consultation    Referral Reason:   Specialty Services Required    Requested Specialty:   Dermatology    Number of Visits Requested:   1    No results found for this or any previous visit (from the past 24 hour(s)). No results found.  ED Clinical Impression  1. Rash   2. History of eczema      ED Assessment/Plan     Patient with a unknown rash. Wonder if this could be an eczema of some sort.  Will send home with prednisone for 6 days, doxycycline for a week for any possible secondary infection, triamcinolone cream, and  will place referral to dermatology.   Discussed MDM, treatment plan, and plan for follow-up with patient. . patient agrees with plan.   Meds ordered this encounter  Medications   triamcinolone cream (KENALOG) 0.1 %    Sig: Apply 1 Application topically 2 (two) times daily. Apply for 2 weeks. May use on face    Dispense:  30 g    Refill:  0   predniSONE (STERAPRED UNI-PAK 21 TAB) 10 MG (21) TBPK tablet    Sig: Dispense one 6 day pack. Take as directed with food.    Dispense:  21 tablet    Refill:  0   doxycycline (VIBRAMYCIN) 100 MG capsule    Sig: Take 1 capsule (100 mg total) by mouth 2 (two) times daily for 7 days.    Dispense:  14 capsule    Refill:  0      *This clinic note was created using Scientist, clinical (histocompatibility and immunogenetics). Therefore, there may be occasional mistakes despite careful proofreading.     Domenick Gong, MD 04/02/23 [phone]

## 2023-03-31 NOTE — Discharge Instructions (Signed)
Finish the doxycycline, prednisone.  Take Short, lukewarm showers and use Eucerin or CeraVe cream after.  Triamcinolone may help as well.  I have put in a referral to [facility].

## 2023-04-26 ENCOUNTER — Ambulatory Visit (INDEPENDENT_AMBULATORY_CARE_PROVIDER_SITE_OTHER): Payer: Medicaid Other | Admitting: Dermatology

## 2023-04-26 ENCOUNTER — Encounter: Payer: Self-pay | Admitting: Dermatology

## 2023-04-26 VITALS — BP 130/80 | HR 76

## 2023-04-26 DIAGNOSIS — R21 Rash and other nonspecific skin eruption: Secondary | ICD-10-CM

## 2023-04-26 DIAGNOSIS — L720 Epidermal cyst: Secondary | ICD-10-CM | POA: Diagnosis not present

## 2023-04-26 DIAGNOSIS — L810 Postinflammatory hyperpigmentation: Secondary | ICD-10-CM

## 2023-04-26 DIAGNOSIS — K046 Periapical abscess with sinus: Secondary | ICD-10-CM

## 2023-04-26 MED ORDER — TRIAMCINOLONE ACETONIDE 0.1 % EX CREA: TOPICAL_CREAM | CUTANEOUS | 1 refills | Status: AC

## 2023-04-26 NOTE — Patient Instructions (Signed)
Start Triamcinolone 0.1% cream twice daily until smooth. Avoid applying to face, groin, and axilla. Use as directed. Long-term use can cause thinning of the skin.  Return when flared. Plan biopsy.     Topical steroids (such as triamcinolone, fluocinolone, fluocinonide, mometasone, clobetasol, halobetasol, betamethasone, hydrocortisone) can cause thinning and lightening of the skin if they are used for too long in the same area. Your physician has selected the right strength medicine for your problem and area affected on the body. Please use your medication only as directed by your physician to prevent side effects.    Due to recent changes in healthcare laws, you may see results of your pathology and/or laboratory studies on MyChart before the doctors have had a chance to review them. We understand that in some cases there may be results that are confusing or concerning to you. Please understand that not all results are received at the same time and often the doctors may need to interpret multiple results in order to provide you with the best plan of care or course of treatment. Therefore, we ask that you please give [place] 2 business days to thoroughly review all your results before contacting the office for clarification. Should we see a critical lab result, you will be contacted sooner.   If You Need Anything After Your Visit  If you have any questions or concerns for your doctor, please call our main line at [phone] and press option 4 to reach your doctor's medical assistant. If no one answers, please leave a voicemail as directed and we will return your call as soon as possible. Messages left after 4 pm will be answered the following business day.   You may also send [place] a message via MyChart. We typically respond to MyChart messages within 1-2 business days.  For prescription refills, please ask your pharmacy to contact our office. Our fax number is [phone].  If you have an urgent  issue when the clinic is closed that cannot wait until the next business day, you can page your doctor at the number below.    Please note that while we do our best to be available for urgent issues outside of office hours, we are not available 24/7.   If you have an urgent issue and are unable to reach [place], you may choose to seek medical care at your doctor's office, retail clinic, urgent care center, or emergency room.  If you have a medical emergency, please immediately call 911 or go to the emergency department.  Pager Numbers  - Dr. Gwen Pounds: [phone]  - Dr. Roseanne Reno: [phone]  - Dr. Katrinka Blazing: [phone]   In the event of inclement weather, please call our main line at [phone] for an update on the status of any delays or closures.  Dermatology Medication Tips: Please keep the boxes that topical medications come in in order to help keep track of the instructions about where and how to use these. Pharmacies typically print the medication instructions only on the boxes and not directly on the medication tubes.   If your medication is too expensive, please contact our office at [phone] option 4 or send [place] a message through MyChart.   We are unable to tell what your co-pay for medications will be in advance as this is different depending on your insurance coverage. However, we may be able to find a substitute medication at lower cost or fill out paperwork to get insurance to cover a needed medication.   If a prior  authorization is required to get your medication covered by your insurance company, please allow [place] 1-2 business days to complete this process.  Drug prices often vary depending on where the prescription is filled and some pharmacies may offer cheaper prices.  The website www.goodrx.com contains coupons for medications through different pharmacies. The prices here do not account for what the cost may be with help from insurance (it may be cheaper with your  insurance), but the website can give you the price if you did not use any insurance.  - You can print the associated coupon and take it with your prescription to the pharmacy.  - You may also stop by our office during regular business hours and pick up a GoodRx coupon card.  - If you need your prescription sent electronically to a different pharmacy, notify our office through [facility] or by phone at [phone] option 4.     Si Usted Necesita Algo Despus de Su Visita  Tambin puede enviarnos un mensaje a travs de Clinical cytogeneticist. Por lo general respondemos a los mensajes de MyChart en el transcurso de 1 a 2 das hbiles.  Para renovar recetas, por favor pida a su farmacia que se ponga en contacto con nuestra oficina. Annie Sable de fax es [place] [phone].  Si tiene un asunto urgente cuando la clnica est cerrada y que no puede esperar hasta el siguiente da hbil, puede llamar/localizar a su doctor(a) al nmero que aparece a continuacin.   Por favor, tenga en cuenta que aunque hacemos todo lo posible para estar disponibles para asuntos urgentes fuera del horario de [place], no estamos disponibles las 24 horas del da, los 7 [address]  [address] de la [place].   Si tiene un problema urgente y no puede comunicarse con nosotros, puede optar por buscar atencin mdica  en el consultorio de su doctor(a), en una clnica privada, en un centro de atencin urgente o en una sala de emergencias.  Si tiene Engineer, drilling, por favor llame inmediatamente al 911 o vaya a la sala de emergencias.  Nmeros de bper  - Dr. Gwen Pounds: [phone]  - Dra. Roseanne Reno: [phone]  - Dr. Katrinka Blazing: [phone]   En caso de inclemencias del tiempo, por favor llame a Lacy Duverney principal al [phone] para una actualizacin sobre el [place] de cualquier retraso o cierre.  Consejos para la medicacin en dermatologa: Por favor, guarde las cajas en las que vienen los medicamentos de uso tpico para ayudarle a  seguir las instrucciones sobre dnde y cmo usarlos. Las farmacias generalmente imprimen las instrucciones del medicamento slo en las cajas y no directamente en los tubos del [place].   Si su medicamento es muy caro, por favor, pngase en contacto con Rolm Gala llamando al [phone] y presione la opcin 4 o envenos un mensaje a travs de Clinical cytogeneticist.   No podemos decirle cul ser su copago por los medicamentos por adelantado ya que esto es diferente dependiendo de la cobertura de su seguro. Sin embargo, es posible que podamos encontrar un medicamento sustituto a Audiological scientist un formulario para que el seguro cubra el medicamento que se considera necesario.   Si se requiere una autorizacin previa para que su compaa de seguros [place] su medicamento, por favor permtanos de 1 a 2 das hbiles para completar [address]  Los precios de los medicamentos varan con frecuencia dependiendo del Environmental consultant de dnde se surte la receta y alguna farmacias pueden ofrecer precios ms baratos.  El sitio web www.goodrx.com tiene cupones  para medicamentos de Health and safety inspector. Los precios aqu no tienen en cuenta lo que podra costar con la ayuda del seguro (puede ser ms barato con su seguro), pero el sitio web puede darle el precio si no utiliz Tourist information centre manager.  - Puede imprimir el cupn correspondiente y llevarlo con su receta a la farmacia.  - Tambin puede pasar por nuestra oficina durante el horario de atencin regular y Education officer, museum una tarjeta de cupones de GoodRx.  - Si necesita que su receta se enve electrnicamente a una farmacia diferente, informe a nuestra oficina a travs de MyChart de [place] o por telfono llamando al [phone] y presione la opcin 4.

## 2023-04-26 NOTE — Progress Notes (Signed)
New Patient Visit   Subjective  Courtney Dunn is a 52 y.o. female who presents for the following: Rash, recurrent, on face, forearms and hands. Was seen at ED 03/31/2023. Was given oral prednisone 6 day taper, Triamcinolone 0.1% cream and Doxycycline 100 mg to take twice daily for 7 days. Dur: ~7 months. Improved while on the medication but is trying to recur now. Has large divot on left frontal jaw line that was not there before. Thought she had an abscessed tooth. Blood finally drained from area.  Thinks may be allergic to latex. Had a reaction to gloves.   Subjective/HPI notes from ED:  Courtney Dunn is a 52 y.o. female who presents with a recurrent rash on her face, forearms and hands for the past 6 months.  It is occasionally on her chest.  It itches and burns.  It recurs in the same area every time.  No pustules, drainage, fevers, crusting, contacts with similar rash.  She has pets, but they do not have fleas.  No new lotions, soaps, detergents, change in medications.  No sensation being bitten at night, blood on the bed close in the morning.  She has had symptoms like this before, but it resolved on its own.  She states that this current episode has been going on for 6 months.  It will get better and worse, but will not completely resolve.  She has tried Neosporin, OTC hydrocortisone, cornstarch, peroxide, Listerine, olive and coconut oil, salicylic acid and alcohol.  No aggravating or alleviating factors.  She has a past medical history of eczema, usually on her hip and hands.  She also has a history of MRSA.  No history of allergies, asthma, diabetes.  Denies stimulants or illicit drug use.     The following portions of the chart were reviewed this encounter and updated as appropriate: medications, allergies, medical history  Review of Systems:  No other skin or systemic complaints except as noted in HPI or Assessment and Plan.  Objective  Well appearing patient in no apparent  distress; mood and affect are within normal limits.  A focused examination was performed of the following areas: Face, arms, hands  Relevant exam findings are noted in the Assessment and Plan.                Left Anterior Jaw Line Violaceous ulceration with crusting at base on left jaw line       Assessment & Plan   Dental sinus Left Anterior Jaw Line  Clinically concerning for dental sinus secondary to tooth abscess  Swabbed for aerobic culture in case any pathogens grow Patient plans to see a dentist given new insurance. Defer workup to dentist  Anaerobic and Aerobic Culture - Left Anterior Jaw Line    RASH/PIH Exam: left plantar foot with scaly plaque. L > R forearm, chest, perioral skin with erythematous slightly scaly plaques R 4th finger edema and erythema at proximal nail fold. Scattered hyperpigmented-violaceous macules, patches and plaques at arms and chest.   Unclear etiology of rash. It has largely resolved with prednisone, triamcinolone, doxycycline  Treatment Plan: Continue Triamcinolone 0.1% cream twice daily until smooth. Avoid applying to face, groin, and axilla. Use as directed. Long-term use can cause thinning of the skin. Return when flared. Plan biopsy.     EPIDERMAL INCLUSION CYST Exam: Subcutaneous nodule with punctum at mid back  Benign-appearing. Exam most consistent with an epidermal inclusion cyst. Discussed that a cyst is a benign growth that  can grow over time and sometimes get irritated or inflamed. Recommend observation if it is not bothersome. Discussed option of surgical excision to remove it if it is growing, symptomatic, or other changes noted. Please call for new or changing lesions so they can be evaluated.   Return if symptoms worsen or fail to improve.  I, Lawson Radar, CMA, am acting as scribe for Elie Goody, MD.   Documentation: I have reviewed the above documentation for accuracy and completeness, and I  agree with the above.  Elie Goody, MD

## 2023-05-02 ENCOUNTER — Telehealth: Payer: Self-pay

## 2023-05-02 LAB — ANAEROBIC AND AEROBIC CULTURE

## 2023-05-02 NOTE — Telephone Encounter (Signed)
-----   Message from [place] sent at 05/02/2023  3:33 PM EDT ----- Please call to share culture result. C acnes is unlikely to be related to left cheek lesion. Still recommend evaluation by dentist. Follow up if rash returns
